# Patient Record
Sex: Female | Born: 1960 | Race: White | Hispanic: No | Marital: Married | Smoking: Former smoker
Health system: Southern US, Community
[De-identification: ages and names within clinical notes are randomized; demographics above are authoritative.]

## PROBLEM LIST (undated history)

## (undated) DIAGNOSIS — M359 Systemic involvement of connective tissue, unspecified: Secondary | ICD-10-CM

## (undated) DIAGNOSIS — G43909 Migraine, unspecified, not intractable, without status migrainosus: Secondary | ICD-10-CM

## (undated) HISTORY — DX: Migraine, unspecified, not intractable, without status migrainosus: G43.909

---

## 1988-05-27 HISTORY — PX: OTHER SURGICAL HISTORY: SHX169

## 1991-05-28 HISTORY — PX: PARTIAL HYSTERECTOMY: SHX80

## 2004-05-27 HISTORY — PX: CHOLECYSTECTOMY: SHX55

## 2004-05-27 HISTORY — PX: OOPHORECTOMY: SHX86

## 2016-03-15 ENCOUNTER — Encounter: Payer: Self-pay | Admitting: Diagnostic Neuroimaging

## 2016-03-15 ENCOUNTER — Ambulatory Visit (INDEPENDENT_AMBULATORY_CARE_PROVIDER_SITE_OTHER): Payer: BLUE CROSS/BLUE SHIELD | Admitting: Neurology

## 2016-03-15 DIAGNOSIS — H4710 Unspecified papilledema: Secondary | ICD-10-CM | POA: Diagnosis not present

## 2016-03-15 DIAGNOSIS — H539 Unspecified visual disturbance: Secondary | ICD-10-CM | POA: Insufficient documentation

## 2016-03-15 DIAGNOSIS — G43909 Migraine, unspecified, not intractable, without status migrainosus: Secondary | ICD-10-CM | POA: Insufficient documentation

## 2016-03-15 DIAGNOSIS — G43009 Migraine without aura, not intractable, without status migrainosus: Secondary | ICD-10-CM

## 2016-03-15 DIAGNOSIS — H9313 Tinnitus, bilateral: Secondary | ICD-10-CM | POA: Diagnosis not present

## 2016-03-15 MED ORDER — SUMATRIPTAN SUCCINATE 100 MG PO TABS
100.0000 mg | ORAL_TABLET | Freq: Once | ORAL | 2 refills | Status: AC | PRN
Start: 2016-03-15 — End: ?

## 2016-03-15 NOTE — Progress Notes (Signed)
[facility]  PATIENT: Carmen White DOB: [birth year]  REFERRING DOCTOR OR PCP:  Dr. Anthony Sar, OD (fax [phone])    Dr. Woody Seller is PCP   SOURCE:   Patient, records from Dr. Marin Comment   HISTORICAL  CHIEF COMPLAINT:  Chief Complaint  Patient presents with  . Mod optic nerve edema, tinnitus    rm 6, New Pt    HISTORY OF PRESENT ILLNESS:  I had the pleasure of seeing your patient, Carmen White, at [facility] for neurologic consultation regarding her bilateral optic nerve edema and headaches. As you know, she is a 55 year old woman reporting headaches who saw Dr. Marin Comment on 02/09/2016 for evaluation of headaches and some visual changes.  Additionally she noted drainage out of the right eye.  During that visit, she was found to have normal visual acuity but bilateral moderate optic nerve edema. No venous pulsations were noted.   Humphrey visual fields were performed showing enlarged blind spots bilaterally.    She is referred for further evaluation.  Carmen White reports a long history of common migraine headaches. These occur about every 2 months and last for the rest of the day or until she falls asleep. When she wakes up that migraine is almost always resolved. Pain is throbbing and is associated with nausea, vomiting, photophobia and phonophobia. Moving increases the pain. Pain is decreased if she sits still in a cold dark room.  Her last migraine was 3 weeks ago. She will take a hydrocodone 5 mg when one occurs and if that does not help willl also take an Excedrin Migraine. She has never tried a triptan. She does not get chronic headache.  She also reports tinnitus specially during allergy season. This is bilateral. Of note, she works in a [employer] and wears earplugs for her entire shift.  She stands up quickly she will get "lightning bugs" in her vision and sometimes lightheadedness. She gets similar symptoms if she bends over and stands up quickly. She has not  noted any blind spots or double vision. Weight has increased to 165 pounds 220 pounds over 15 years and she feels the weight gain  has been slow and steady.    She has not been told that she snores and she does not wake up gasping for air.      REVIEW OF SYSTEMS: Constitutional: No fevers, chills, sweats, or change in appetite Eyes: as above Ear, nose and throat: No hearing loss, ear pain, nasal congestion, sore throat Cardiovascular: No chest pain, palpitations Respiratory: No shortness of breath at rest or with exertion.   No wheezes GastrointestinaI: No nausea, vomiting, diarrhea, abdominal pain, fecal incontinence.   She has GERD Genitourinary: No dysuria, urinary retention or frequency.  No nocturia. Musculoskeletal: No neck pain,.  She reports back pain and has had epidural steroid injections in the past.  Integumentary: No rash, pruritus, skin lesions Neurological: as above Psychiatric: Has had some depression and anxiety but not severe.   Endocrine: No palpitations, diaphoresis, change in appetite, change in weigh or increased thirst Hematologic/Lymphatic: No anemia, purpura, petechiae. Allergic/Immunologic: Notes itchy/runny eyes, nasal congestion several times a year.   Denies rashes  ALLERGIES: No Known Allergies  HOME MEDICATIONS:  Current Outpatient Prescriptions:  .  Ascorbic Acid (VITAMIN C) 1000 MG tablet, Take 1,000 mg by mouth daily., Disp: , Rfl:  .  aspirin 81 MG chewable tablet, Chew by mouth daily., Disp: , Rfl:  .  Calcium 600-200 MG-UNIT tablet, Take 1 tablet  by mouth daily., Disp: , Rfl:  .  Cholecalciferol (VITAMIN D-3) 1000 units CAPS, Take by mouth. 5000 mg 4 a day, Disp: , Rfl:  .  cyclobenzaprine (FLEXERIL) 10 MG tablet, 10 mg daily., Disp: , Rfl: 0 .  DULoxetine (CYMBALTA) 60 MG capsule, 60 mg daily., Disp: , Rfl: 2 .  esomeprazole (NEXIUM) 40 MG capsule, 40 mg daily., Disp: , Rfl: 1 .  ezetimibe (ZETIA) 10 MG tablet, 10 mg daily., Disp: , Rfl: 2 .   HYDROcodone-acetaminophen (NORCO/VICODIN) 5-325 MG tablet, Take 1 tablet by mouth every 6 (six) hours as needed for moderate pain., Disp: , Rfl:  .  ranitidine (ZANTAC) 300 MG tablet, 300 mg 2 (two) times daily., Disp: , Rfl: 1 .  zinc gluconate 50 MG tablet, Take 50 mg by mouth daily., Disp: , Rfl:   PAST MEDICAL HISTORY: Past Medical History:  Diagnosis Date  . Migraines     PAST SURGICAL HISTORY: Past Surgical History:  Procedure Laterality Date  . CHOLECYSTECTOMY  2006  . OOPHORECTOMY Left 2006  . PARTIAL HYSTERECTOMY  1993  . tubal ligaiton  1990    FAMILY HISTORY: Family History  Problem Relation Age of Onset  . Cancer Sister   . Hypertension Sister   . Hypertension Brother   . Diabetes Brother     SOCIAL HISTORY:  Social History   Social History  . Marital status: Married    Spouse name: N/A  . Number of children: 4  . Years of education: 12   Occupational History  .      [employer]   Social History Main Topics  . Smoking status: Current Every Day Smoker    Packs/day: 0.75  . Smokeless tobacco: Never Used  . Alcohol use Yes     Comment: 8 oz weekly  . Drug use: No  . Sexual activity: Not on file   Other Topics Concern  . Not on file   Social History Narrative   Lives at home with husband, sons   Caffeine - 03/15/16 currently none due to UTI, hx 2 -20 oz bottles Mtn Dew daily     PHYSICAL EXAM  Vitals:   03/15/16 1056  BP: 130/80  Pulse: 77  Weight: 221 lb 3.2 oz (100.3 kg)  Height: 5\' 9"  (1.753 m)    Body mass index is 32.67 kg/m.   General: The patient is well-developed and well-nourished and in no acute distress  Eyes:  Funduscopic exam shows mild left optic nerve edema.   Due to small right pupil size, I can't see fundus well on that side.  Neck: The neck is supple, no carotid bruits are noted.  The neck is nontender.  Cardiovascular: The heart has a regular rate and rhythm with a normal S1 and S2. There were no murmurs,  gallops or rubs. Lungs are clear to auscultation.  Skin: Extremities are without significant edema.  Musculoskeletal:  Back is nontender  Neurologic Exam  Mental status: The patient is alert and oriented x 3 at the time of the examination. The patient has apparent normal recent and remote memory, with an apparently normal attention span and concentration ability.   Speech is normal.  Cranial nerves: Extraocular movements are full. The left pupil is smaller than the left.   Both react.  Visual fields are full.  Facial symmetry is present. There is good facial sensation to soft touch bilaterally.Facial strength is normal.  Trapezius and sternocleidomastoid strength is normal. No dysarthria is noted.  The tongue is midline, and the patient has symmetric elevation of the soft palate. No obvious hearing deficits are noted.  Motor:  Muscle bulk is normal.   Tone is normal. Strength is  5 / 5 in all 4 extremities.   Sensory: Sensory testing is intact to pinprick, soft touch and vibration sensation in all 4 extremities.  Coordination: Cerebellar testing reveals good finger-nose-finger and heel-to-shin bilaterally.  Gait and station: Station is normal.   Gait is normal. Tandem gait is normal. Romberg is negative.   Reflexes: Deep tendon reflexes are symmetric and normal bilaterally.   Plantar responses are flexor.    DIAGNOSTIC DATA (LABS, IMAGING, TESTING) - I reviewed patient records, labs, notes, testing and imaging myself where available.      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  On my examiation I was able to confirm the papilledema on the left though I had difficulty seeing the fundus on the right.   I discussed with Carmen White that the most likely aspiration for her symptoms is idiopathic intracranial hypertension (pseudotumor cerebri) to rule out other processes such as an intracranial mass I will get an MRI of the brain with and without contrast. If there is no contraindication, after that we will proceed with a lumbar puncture to measure intracranial pressures.     If pressures are elevated I will start acetazolamide and have her follow up with Dr. Marin Comment to assess for resolution of optic nerve edema and to make sure visual fields are not worsening.   Her headaches appear to be more typical common migraines and I will prescribe sumatriptan to help her recover from these more quickly.   she will return to see me in 2 months or sooner if there are new or worsening neurologic symptoms. We will keep her informed of the results of the MRI and if she has one, the lumbar puncture.   She should call us back sooner if she has new or worsening neurologic symptoms.  Thank you for asking me to see Carmen White for a neurologic consultation. Please let me know if I can be of further assistance with her or other patients in the future.   Bridney Guadarrama A. Felecia Shelling, MD, PhD [id number], [id number] AM Certified in Neurology, Clinical Neurophysiology, Sleep Medicine, Pain Medicine and Neuroimaging  [facility] [address] [place], [place] [address] [phone]

## 2016-03-29 ENCOUNTER — Telehealth: Payer: Self-pay | Admitting: Neurology

## 2016-03-29 DIAGNOSIS — H4710 Unspecified papilledema: Secondary | ICD-10-CM

## 2016-03-29 DIAGNOSIS — H539 Unspecified visual disturbance: Secondary | ICD-10-CM

## 2016-03-29 NOTE — Telephone Encounter (Signed)
Carmen White wants her MRI to be done at [facility] in [place]. I spoke with the scheduler and made her appointment for 04/03/16. She said that the patient needed to have some lab work.. If you put the order in I can fax the lab order along with the MRI order to [facility] because she can have the lab work one hour before her MRI.

## 2016-03-29 NOTE — Telephone Encounter (Signed)
Noted, thank you faxed the MRI order and lab order to [facility].

## 2016-04-01 NOTE — Telephone Encounter (Signed)
I spoke with Carmen White this morning and informed her that I made her appt at [facility] for this Wednesday 04/03/16 for her MRI.Marland Kitchen She informed me that she cannot do Wednesday. I told her she can call and rescheduled with [place] that is a good time for her and I gave their phone number which is [phone] opt [phone]

## 2016-07-02 ENCOUNTER — Telehealth: Payer: Self-pay | Admitting: *Deleted

## 2016-07-02 NOTE — Telephone Encounter (Signed)
LMTC./fim 

## 2016-07-02 NOTE — Telephone Encounter (Signed)
-----   Message from Britt Bottom, MD sent at 07/01/2016  5:35 PM EST ----- I had seen Carmen White for consultation regarding papilledema a couple months ago and she had an MRI of the brain at [facility] which is essentially normal.     I haven't seen her back in follow-up and I don't see an appointment scheduled.     She likely has increased cranial pressure and the plan was to get a lumbar puncture to measure pressure the MRI was normal.     If she is still having visual symptoms, we need to get this.

## 2017-05-21 ENCOUNTER — Ambulatory Visit: Payer: BLUE CROSS/BLUE SHIELD | Admitting: Cardiovascular Disease

## 2017-05-21 ENCOUNTER — Telehealth: Payer: Self-pay | Admitting: Cardiovascular Disease

## 2017-05-21 ENCOUNTER — Encounter: Payer: Self-pay | Admitting: Cardiovascular Disease

## 2017-05-21 ENCOUNTER — Encounter: Payer: Self-pay | Admitting: *Deleted

## 2017-05-21 VITALS — BP 125/82 | HR 74 | Ht 68.0 in | Wt 231.0 lb

## 2017-05-21 DIAGNOSIS — K219 Gastro-esophageal reflux disease without esophagitis: Secondary | ICD-10-CM | POA: Diagnosis not present

## 2017-05-21 DIAGNOSIS — Z87891 Personal history of nicotine dependence: Secondary | ICD-10-CM | POA: Diagnosis not present

## 2017-05-21 DIAGNOSIS — E781 Pure hyperglyceridemia: Secondary | ICD-10-CM

## 2017-05-21 DIAGNOSIS — R0609 Other forms of dyspnea: Secondary | ICD-10-CM

## 2017-05-21 NOTE — Progress Notes (Signed)
CARDIOLOGY CONSULT NOTE  Patient ID: Carmen White MRN: [id number] DOB/AGE: [birth year] 56 y.o.  Admit date: (Not on file) Primary Physician: Glenda Chroman, MD Referring Physician: Arsenio Katz, NP.  Reason for Consultation: Shortness of breath  HPI: Carmen White is a 56 y.o. female who is being seen today for the evaluation of shortness of breathat the request of Arsenio Katz, NP.  Past medical history includes GERD and hypercholesterolemia.  She has a prior history of tobacco abuse and quit in April 2018.  I personally reviewed all relevant documentation, labs, and studies from her PCP.  ECG performed at an outside facility on 04/14/17 demonstrated vigorous left ventricular systolic function, LVEF 25-36%, normal regional wall motion, diastolic dysfunction, and no significant valvular pathology.  ECG performed on 04/07/17 which I personally interpreted demonstrated sinus rhythm with underlying artifact and nonspecific ST segment and T wave abnormalities.  She said she has been experiencing exertional dyspnea for the past 2 months.  It has progressively gotten worse.  While walking with her son she has to stop several times.  When she bends over to tie her shoes she said she feels like she is going to pass out.  She has retrosternal chest tightness which is not associated with exertion and she attributes this to acid reflux disease.  She has also had a 30 pound weight gain in the past 8 months.  She smoked a pack of cigarettes daily for 40 years.  She has chronic lower extremity swelling bilaterally and uses compression stockings.  She works for a [employer] on concrete floors all night long.     No Known Allergies  Current Outpatient Medications  Medication Sig Dispense Refill  . Ascorbic Acid (VITAMIN C) 1000 MG tablet Take 1,000 mg by mouth daily.    Marland Kitchen aspirin 81 MG chewable tablet Chew by mouth daily.    . Calcium 600-200 MG-UNIT tablet Take 1 tablet by mouth daily.     . Cholecalciferol (VITAMIN D-3) 1000 units CAPS Take by mouth. 5000 mg 4 a day    . cyclobenzaprine (FLEXERIL) 10 MG tablet 10 mg daily.  0  . DULoxetine (CYMBALTA) 60 MG capsule 60 mg daily.  2  . esomeprazole (NEXIUM) 40 MG capsule 40 mg daily.  1  . ezetimibe (ZETIA) 10 MG tablet 10 mg daily.  2  . HYDROcodone-acetaminophen (NORCO/VICODIN) 5-325 MG tablet Take 1 tablet by mouth every 6 (six) hours as needed for moderate pain.    . ranitidine (ZANTAC) 300 MG tablet 300 mg 2 (two) times daily.  1  . SUMAtriptan (IMITREX) 100 MG tablet Take 1 tablet (100 mg total) by mouth once as needed for migraine. May repeat in 2 hours if headache persists or recurs. 10 tablet 2  . zinc gluconate 50 MG tablet Take 50 mg by mouth daily.     No current facility-administered medications for this visit.     Past Medical History:  Diagnosis Date  . Migraines     Past Surgical History:  Procedure Laterality Date  . CHOLECYSTECTOMY  2006  . OOPHORECTOMY Left 2006  . PARTIAL HYSTERECTOMY  1993  . tubal ligaiton  1990    Social History   Socioeconomic History  . Marital status: Married    Spouse name: Not on file  . Number of children: 4  . Years of education: 28  . Highest education level: Not on file  Social Needs  . Financial resource strain: Not on file  .  Food insecurity - worry: Not on file  . Food insecurity - inability: Not on file  . Transportation needs - medical: Not on file  . Transportation needs - non-medical: Not on file  Occupational History    Comment: [employer]  Tobacco Use  . Smoking status: Former Smoker    Packs/day: 0.75    Years: 41.00    Pack years: 30.75    Start date: 06/29/1975    Last attempt to quit: 09/19/2016    Years since quitting: 0.6  . Smokeless tobacco: Never Used  Substance and Sexual Activity  . Alcohol use: Yes    Comment: 8 oz weekly  . Drug use: No  . Sexual activity: Not on file  Other Topics Concern  . Not on file  Social History  Narrative   Lives at home with husband, sons   Caffeine - 03/15/16 currently none due to UTI, hx 2 -20 oz bottles Mtn Dew daily     No family history of premature CAD in 1st degree relatives.  Current Meds  Medication Sig  . Ascorbic Acid (VITAMIN C) 1000 MG tablet Take 1,000 mg by mouth daily.  Marland Kitchen aspirin 81 MG chewable tablet Chew by mouth daily.  . Calcium 600-200 MG-UNIT tablet Take 1 tablet by mouth daily.  . Cholecalciferol (VITAMIN D-3) 1000 units CAPS Take by mouth. 5000 mg 4 a day  . cyclobenzaprine (FLEXERIL) 10 MG tablet 10 mg daily.  . DULoxetine (CYMBALTA) 60 MG capsule 60 mg daily.  Marland Kitchen esomeprazole (NEXIUM) 40 MG capsule 40 mg daily.  Marland Kitchen ezetimibe (ZETIA) 10 MG tablet 10 mg daily.  Marland Kitchen HYDROcodone-acetaminophen (NORCO/VICODIN) 5-325 MG tablet Take 1 tablet by mouth every 6 (six) hours as needed for moderate pain.  . ranitidine (ZANTAC) 300 MG tablet 300 mg 2 (two) times daily.  . SUMAtriptan (IMITREX) 100 MG tablet Take 1 tablet (100 mg total) by mouth once as needed for migraine. May repeat in 2 hours if headache persists or recurs.  Marland Kitchen zinc gluconate 50 MG tablet Take 50 mg by mouth daily.      Review of systems complete and found to be negative unless listed above in HPI    Physical exam Blood pressure 125/82, pulse 74, height 5\' 8"  (1.727 m), weight 231 lb (104.8 kg), SpO2 94 %. General: NAD Neck: No JVD, no thyromegaly or thyroid nodule.  Lungs: Clear to auscultation bilaterally with normal respiratory effort. CV: Nondisplaced PMI. Regular rate and rhythm, normal S1/S2, no S3/S4, no murmur.  No peripheral edema.  No carotid bruit.    Abdomen: Soft, nontender, no distention.  Skin: Intact without lesions or rashes.  Neurologic: Alert and oriented x 3.  Psych: Normal affect. Extremities: No clubbing or cyanosis.  HEENT: Normal.   ECG: Most recent ECG reviewed.   Labs: No results found for: K, BUN, CREATININE, ALT, TSH, HGB   Lipids: No results found for:  LDLCALC, LDLDIRECT, CHOL, TRIG, HDL      ASSESSMENT AND PLAN:  1.  Exertional dyspnea: Risk factors for cardiac disease include hypertriglyceridemia and a long history of tobacco abuse.  I will obtain an exercise Myoview nuclear stress test to evaluate for ischemic heart disease.  She is also certainly at risk for chronic obstructive lung disease.  2.  Hypercholesterolemia: Taking Zetia.  3.  GERD: Takes Nexium and Zantac.     Disposition: Follow up in 6 weeks.  Signed: Kate Sable, M.D., F.A.C.C.  05/21/2017, 10:09 AM

## 2017-05-21 NOTE — Patient Instructions (Addendum)
Medication Instructions:  Continue all current medications.  Labwork: none  Testing/Procedures: Your physician has requested that you have an exercise stress myoview. For further information please visit [email or web address]. Please follow instruction sheet, as given.  Office will contact with results via phone or letter.    Follow-Up: 6 weeks   Any Other Special Instructions Will Be Listed Below (If Applicable).   If you need a refill on your cardiac medications before your next appointment, please call your pharmacy.  

## 2017-05-21 NOTE — Telephone Encounter (Signed)
Pre-cert Verification for the following procedure  Stress test scheduled for 06-13-17 at [facility]

## 2017-06-13 ENCOUNTER — Ambulatory Visit (HOSPITAL_COMMUNITY)
Admission: RE | Admit: 2017-06-13 | Discharge: 2017-06-13 | Disposition: A | Discharge status: Home / Self Care | Payer: BLUE CROSS/BLUE SHIELD | Source: Ambulatory Visit | Attending: Cardiovascular Disease | Admitting: Cardiovascular Disease

## 2017-06-13 ENCOUNTER — Encounter (HOSPITAL_COMMUNITY)
Admission: RE | Admit: 2017-06-13 | Discharge: 2017-06-13 | Disposition: A | Discharge status: Home / Self Care | Payer: BLUE CROSS/BLUE SHIELD | Source: Ambulatory Visit | Attending: Cardiovascular Disease | Admitting: Cardiovascular Disease

## 2017-06-13 ENCOUNTER — Telehealth: Payer: Self-pay | Admitting: *Deleted

## 2017-06-13 ENCOUNTER — Encounter (HOSPITAL_COMMUNITY): Payer: Self-pay

## 2017-06-13 DIAGNOSIS — R0609 Other forms of dyspnea: Secondary | ICD-10-CM | POA: Insufficient documentation

## 2017-06-13 DIAGNOSIS — R9439 Abnormal result of other cardiovascular function study: Secondary | ICD-10-CM | POA: Insufficient documentation

## 2017-06-13 HISTORY — DX: Systemic involvement of connective tissue, unspecified: M35.9

## 2017-06-13 LAB — NM MYOCAR MULTI W/SPECT W/WALL MOTION / EF
CHL CUP NUCLEAR SSS: 5
CHL RATE OF PERCEIVED EXERTION: 13
CSEPED: 4 min
Estimated workload: 7 METS
Exercise duration (sec): 21 s
LHR: 0.39
LV dias vol: 96 mL (ref 46–106)
LVSYSVOL: 38 mL
MPHR: 162 {beats}/min
NUC STRESS TID: 1.06
Peak HR: 155 {beats}/min
Percent HR: 95 %
Rest HR: 75 {beats}/min
SDS: 4
SRS: 1

## 2017-06-13 MED ORDER — TECHNETIUM TC 99M TETROFOSMIN IV KIT
30.0000 | PACK | Freq: Once | INTRAVENOUS | Status: AC | PRN
  Administered 2017-06-13: 32 via INTRAVENOUS

## 2017-06-13 MED ORDER — SODIUM CHLORIDE 0.9% FLUSH
INTRAVENOUS | Status: AC
  Administered 2017-06-13: 10 mL via INTRAVENOUS
  Filled 2017-06-13: qty 10

## 2017-06-13 MED ORDER — TECHNETIUM TC 99M TETROFOSMIN IV KIT
10.0000 | PACK | Freq: Once | INTRAVENOUS | Status: AC | PRN
  Administered 2017-06-13: 11 via INTRAVENOUS

## 2017-06-13 MED ORDER — REGADENOSON 0.4 MG/5ML IV SOLN
INTRAVENOUS | Status: AC
  Filled 2017-06-13: qty 5

## 2017-06-13 NOTE — Telephone Encounter (Signed)
Notes recorded by Laurine Blazer, LPN on 10/23/509 at 0:21 PM EST Patient notified. Copy to pmd. Follow up scheduled for 06/27/2017 with Dr. Bronson Ing.   ------  Notes recorded by Herminio Commons, MD on 06/13/2017 at 4:46 PM EST Low risk overall with normal LV function. There are some abnormalities which may be due to "shadowing" artifact. I will discuss at fu ov.

## 2017-06-27 ENCOUNTER — Ambulatory Visit: Payer: BLUE CROSS/BLUE SHIELD | Admitting: Cardiovascular Disease

## 2017-06-27 ENCOUNTER — Encounter: Payer: Self-pay | Admitting: Cardiovascular Disease

## 2017-06-27 ENCOUNTER — Other Ambulatory Visit: Payer: Self-pay

## 2017-06-27 VITALS — BP 138/82 | HR 77 | Ht 69.0 in | Wt 238.0 lb

## 2017-06-27 DIAGNOSIS — Z87891 Personal history of nicotine dependence: Secondary | ICD-10-CM | POA: Diagnosis not present

## 2017-06-27 DIAGNOSIS — R0609 Other forms of dyspnea: Secondary | ICD-10-CM | POA: Diagnosis not present

## 2017-06-27 DIAGNOSIS — K219 Gastro-esophageal reflux disease without esophagitis: Secondary | ICD-10-CM

## 2017-06-27 DIAGNOSIS — E781 Pure hyperglyceridemia: Secondary | ICD-10-CM | POA: Diagnosis not present

## 2017-06-27 NOTE — Patient Instructions (Addendum)
Medication Instructions:  Continue all current medications.  Labwork: none  Testing/Procedures: none  Follow-Up: As needed.    Any Other Special Instructions Will Be Listed Below (If Applicable).  If you need a refill on your cardiac medications before your next appointment, please call your pharmacy.  

## 2017-06-27 NOTE — Progress Notes (Signed)
SUBJECTIVE: The patient returns for follow-up after undergoing cardiovascular testing performed for the evaluation of exertional dyspnea.  Nuclear stress test on 06/13/17 demonstrated a hypertensive response to exercise.  Overall findings were suggestive of prior myocardial infarction with a mild degree of peri-infarct ischemia in the inferolateral wall.  However, variable soft tissue attenuation artifact could not entirely be ruled out.  It was a low risk study, LVEF 60%.  She has problems with acid reflux.  She quit smoking and has put on 40 pounds in the past year and thinks her exertional dyspnea is likely due to this.  She denies chest pain, palpitations, and leg swelling.     Review of Systems: As per "subjective", otherwise negative.  No Known Allergies  Current Outpatient Medications  Medication Sig Dispense Refill  . Ascorbic Acid (VITAMIN C) 1000 MG tablet Take 1,000 mg by mouth daily.    Marland Kitchen aspirin 81 MG chewable tablet Chew by mouth daily.    . Calcium 600-200 MG-UNIT tablet Take 1 tablet by mouth daily.    . Cholecalciferol (VITAMIN D-3) 1000 units CAPS Take by mouth. 5000 mg 4 a day    . cyclobenzaprine (FLEXERIL) 10 MG tablet 10 mg daily.  0  . DULoxetine (CYMBALTA) 60 MG capsule 60 mg daily.  2  . esomeprazole (NEXIUM) 40 MG capsule 40 mg daily.  1  . ezetimibe (ZETIA) 10 MG tablet 10 mg daily.  2  . HYDROcodone-acetaminophen (NORCO/VICODIN) 5-325 MG tablet Take 1 tablet by mouth every 6 (six) hours as needed for moderate pain.    . ranitidine (ZANTAC) 300 MG tablet 300 mg 2 (two) times daily.  1  . SUMAtriptan (IMITREX) 100 MG tablet Take 1 tablet (100 mg total) by mouth once as needed for migraine. May repeat in 2 hours if headache persists or recurs. 10 tablet 2  . zinc gluconate 50 MG tablet Take 50 mg by mouth daily.     No current facility-administered medications for this visit.     Past Medical History:  Diagnosis Date  . Collagen vascular disease  ([place])   . Migraines     Past Surgical History:  Procedure Laterality Date  . CHOLECYSTECTOMY  2006  . OOPHORECTOMY Left 2006  . PARTIAL HYSTERECTOMY  1993  . tubal ligaiton  1990    Social History   Socioeconomic History  . Marital status: Married    Spouse name: Not on file  . Number of children: 4  . Years of education: 54  . Highest education level: Not on file  Social Needs  . Financial resource strain: Not on file  . Food insecurity - worry: Not on file  . Food insecurity - inability: Not on file  . Transportation needs - medical: Not on file  . Transportation needs - non-medical: Not on file  Occupational History    Comment: [employer]  Tobacco Use  . Smoking status: Former Smoker    Packs/day: 0.75    Years: 41.00    Pack years: 30.75    Start date: 06/29/1975    Last attempt to quit: 09/19/2016    Years since quitting: 0.7  . Smokeless tobacco: Never Used  Substance and Sexual Activity  . Alcohol use: Yes    Comment: 8 oz weekly  . Drug use: No  . Sexual activity: Not on file  Other Topics Concern  . Not on file  Social History Narrative   Lives at home with husband, sons  Caffeine - 03/15/16 currently none due to UTI, hx 2 -20 oz bottles Mtn Dew daily     Vitals:   06/27/17 1052  BP: 138/82  Pulse: 77  SpO2: 95%  Weight: 238 lb (108 kg)  Height: 5\' 9"  (1.753 m)    Wt Readings from Last 3 Encounters:  06/27/17 238 lb (108 kg)  05/21/17 231 lb (104.8 kg)  03/15/16 221 lb 3.2 oz (100.3 kg)     PHYSICAL EXAM General: NAD HEENT: Normal. Neck: No JVD, no thyromegaly. Lungs: Clear to auscultation bilaterally with normal respiratory effort. CV: Regular rate and rhythm, normal S1/S2, no S3/S4, no murmur. No pretibial or periankle edema.  No carotid bruit.   Abdomen: Soft, nontender, no distention.  Neurologic: Alert and oriented.  Psych: Normal affect. Skin: Normal. Musculoskeletal: No gross deformities.    ECG: Most recent ECG  reviewed.   Labs: No results found for: K, BUN, CREATININE, ALT, TSH, HGB   Lipids: No results found for: LDLCALC, LDLDIRECT, CHOL, TRIG, HDL     ASSESSMENT AND PLAN: 1.  Exertional dyspnea: I feel that the findings on stress testing were likely due to soft tissue attenuation artifact.  Given her long history of tobacco abuse, I would consider pulmonary function testing to evaluate for COPD.  2.  Hypercholesterolemia: Taking Zetia.  3.  GERD: Takes Nexium and Zantac.    Disposition: Follow up as needed   Kate Sable, M.D., F.A.C.C.

## 2017-10-27 ENCOUNTER — Ambulatory Visit (INDEPENDENT_AMBULATORY_CARE_PROVIDER_SITE_OTHER): Payer: BLUE CROSS/BLUE SHIELD | Admitting: Otolaryngology

## 2017-10-27 DIAGNOSIS — R1312 Dysphagia, oropharyngeal phase: Secondary | ICD-10-CM | POA: Diagnosis not present

## 2017-10-27 DIAGNOSIS — K219 Gastro-esophageal reflux disease without esophagitis: Secondary | ICD-10-CM

## 2017-10-27 DIAGNOSIS — D440 Neoplasm of uncertain behavior of thyroid gland: Secondary | ICD-10-CM

## 2017-10-28 ENCOUNTER — Other Ambulatory Visit (INDEPENDENT_AMBULATORY_CARE_PROVIDER_SITE_OTHER): Payer: Self-pay | Admitting: Otolaryngology

## 2017-10-28 DIAGNOSIS — R1310 Dysphagia, unspecified: Secondary | ICD-10-CM

## 2017-10-28 DIAGNOSIS — E041 Nontoxic single thyroid nodule: Secondary | ICD-10-CM

## 2017-12-19 ENCOUNTER — Ambulatory Visit (HOSPITAL_COMMUNITY)
Admission: RE | Admit: 2017-12-19 | Discharge: 2017-12-19 | Disposition: A | Discharge status: Home / Self Care | Payer: BLUE CROSS/BLUE SHIELD | Source: Ambulatory Visit | Attending: Otolaryngology | Admitting: Otolaryngology

## 2017-12-19 DIAGNOSIS — R1310 Dysphagia, unspecified: Secondary | ICD-10-CM | POA: Insufficient documentation

## 2017-12-19 DIAGNOSIS — E041 Nontoxic single thyroid nodule: Secondary | ICD-10-CM | POA: Diagnosis not present

## 2018-01-06 ENCOUNTER — Other Ambulatory Visit (INDEPENDENT_AMBULATORY_CARE_PROVIDER_SITE_OTHER): Payer: Self-pay | Admitting: Otolaryngology

## 2018-01-06 DIAGNOSIS — E041 Nontoxic single thyroid nodule: Secondary | ICD-10-CM

## 2018-01-12 ENCOUNTER — Ambulatory Visit (INDEPENDENT_AMBULATORY_CARE_PROVIDER_SITE_OTHER): Payer: BLUE CROSS/BLUE SHIELD | Admitting: Otolaryngology

## 2018-01-12 DIAGNOSIS — R1312 Dysphagia, oropharyngeal phase: Secondary | ICD-10-CM | POA: Diagnosis not present

## 2018-01-12 DIAGNOSIS — D440 Neoplasm of uncertain behavior of thyroid gland: Secondary | ICD-10-CM

## 2018-01-12 DIAGNOSIS — K219 Gastro-esophageal reflux disease without esophagitis: Secondary | ICD-10-CM

## 2018-01-22 ENCOUNTER — Ambulatory Visit (HOSPITAL_COMMUNITY)
Admission: RE | Admit: 2018-01-22 | Discharge: 2018-01-22 | Disposition: A | Discharge status: Home / Self Care | Payer: BLUE CROSS/BLUE SHIELD | Source: Ambulatory Visit | Attending: Otolaryngology | Admitting: Otolaryngology

## 2018-01-22 ENCOUNTER — Encounter (HOSPITAL_COMMUNITY): Payer: Self-pay

## 2018-01-22 DIAGNOSIS — E041 Nontoxic single thyroid nodule: Secondary | ICD-10-CM | POA: Diagnosis present

## 2018-01-22 MED ORDER — LIDOCAINE HCL (PF) 2 % IJ SOLN
INTRAMUSCULAR | Status: AC
  Filled 2018-01-22: qty 10

## 2019-02-04 ENCOUNTER — Ambulatory Visit (INDEPENDENT_AMBULATORY_CARE_PROVIDER_SITE_OTHER): Payer: BLUE CROSS/BLUE SHIELD | Admitting: Otolaryngology

## 2019-06-06 IMAGING — RF DG ESOPHAGUS
7 of 8 series · 15 of 19 positions shown · non-contrast
Comparison: None.

CLINICAL DATA: Dysphagia.

EXAM:
ESOPHOGRAM / BARIUM SWALLOW / BARIUM TABLET STUDY
TECHNIQUE: Combined double contrast and single contrast examination performed
using effervescent crystals, thick barium liquid, and thin barium
liquid. The patient was observed with fluoroscopy swallowing a 13 mm
barium sulphate tablet.
FLUOROSCOPY TIME:  Fluoroscopy Time:  1 minutes 12 seconds

[Series 1: cp_standard · 0.28mm/px · 2 of 13 frames shown (1 of 4)]
[frame 2/13]
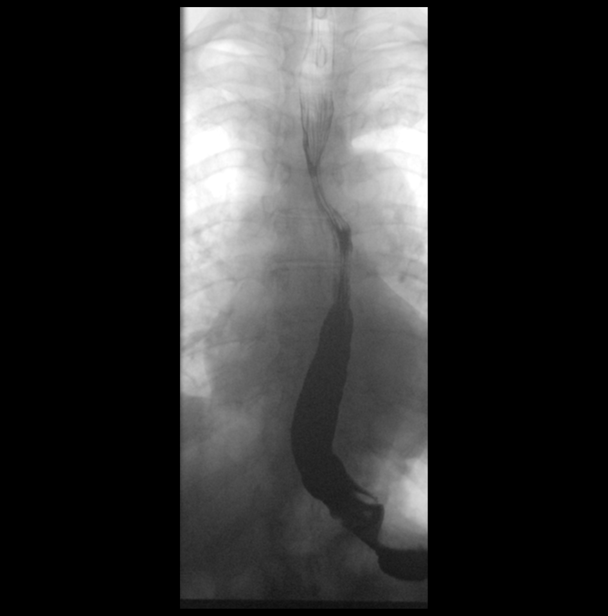
[frame 7/13]
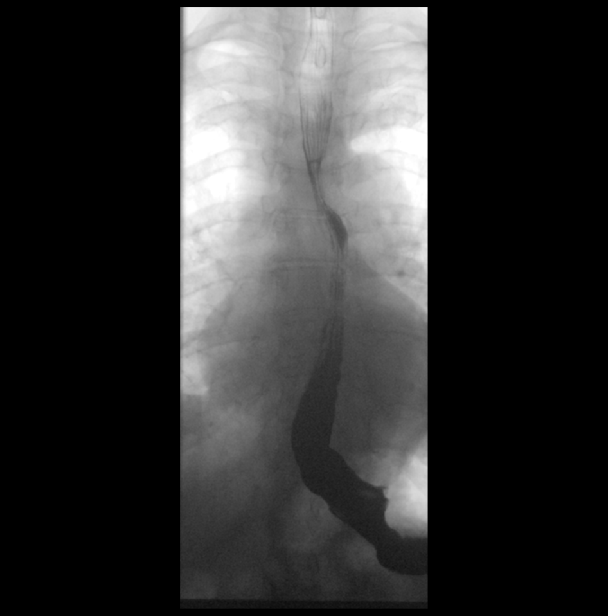

[Series 2: cp_standard · 0.28mm/px · 4 of 80 frames shown (2 of 4)]
[frame 13/80]
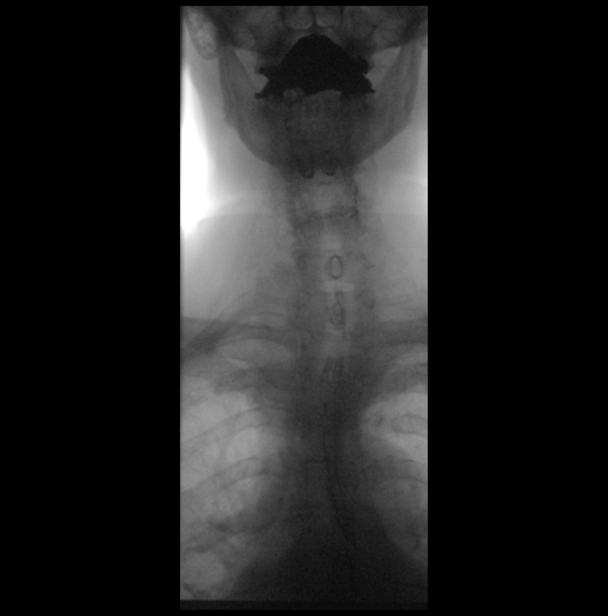
[frame 18/80]
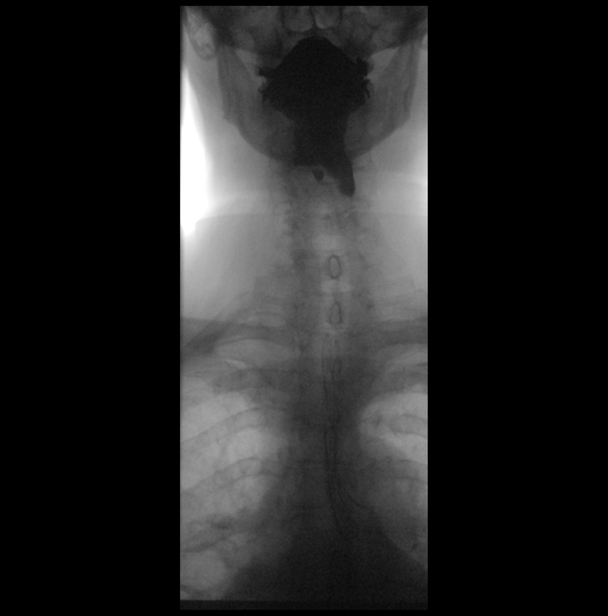
[frame 41/80]
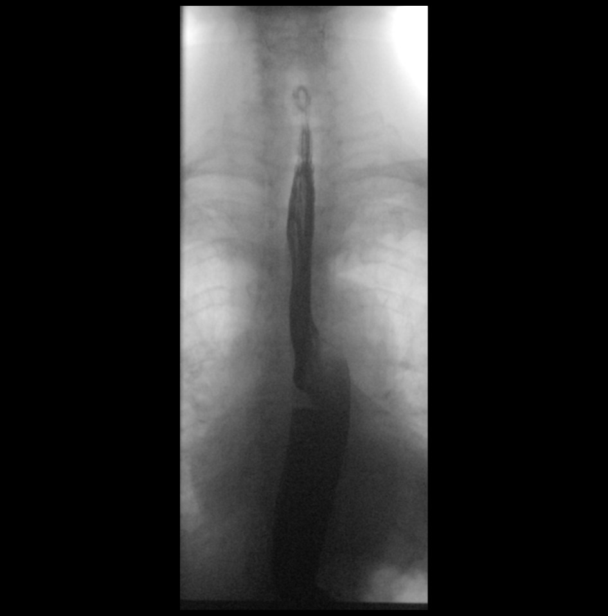
[frame 69/80]
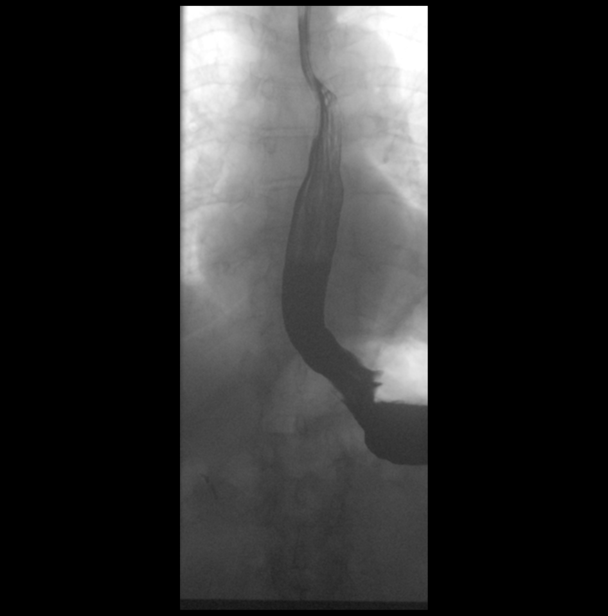

[Series 3: cp_standard · 0.26mm/px · 3 of 70 frames shown (3 of 4)]
[frame 36/70]
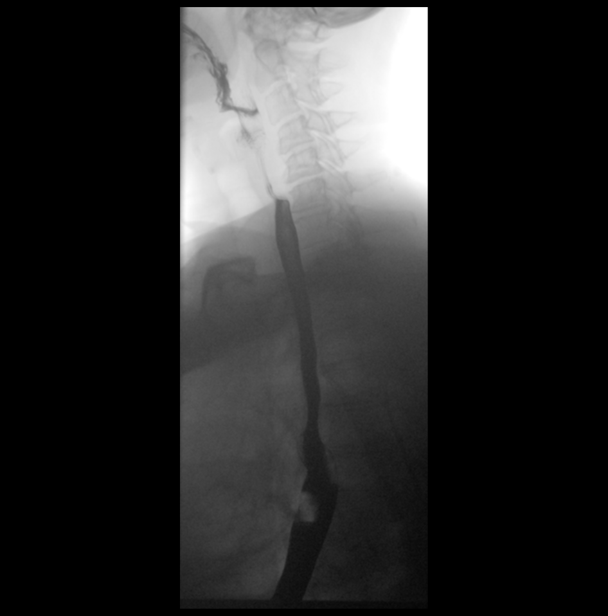
[frame 60/70]
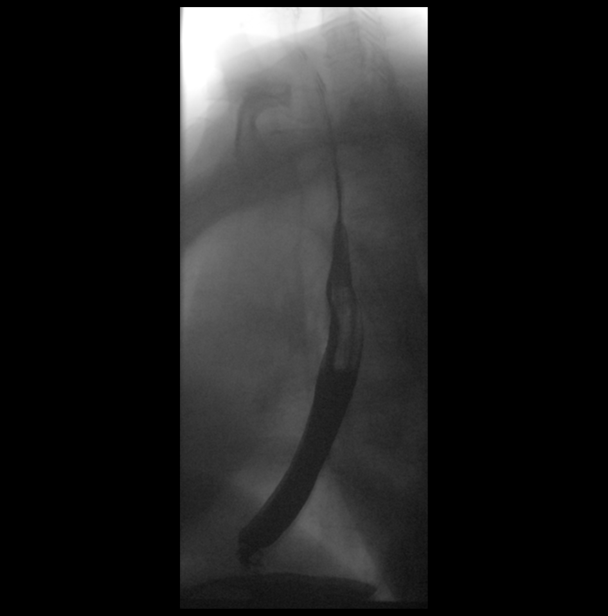
[frame 70/70]
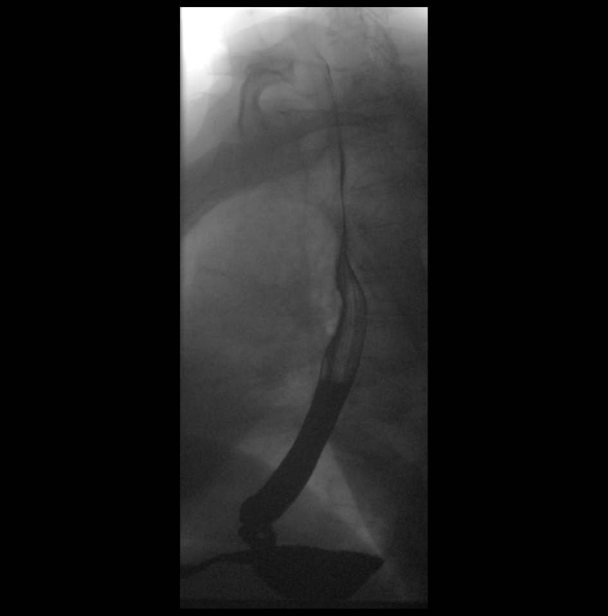

[Series 5: cp_standard · 0.26mm/px · 1 of 1 slices shown (4 of 4)]
[im 1/1]
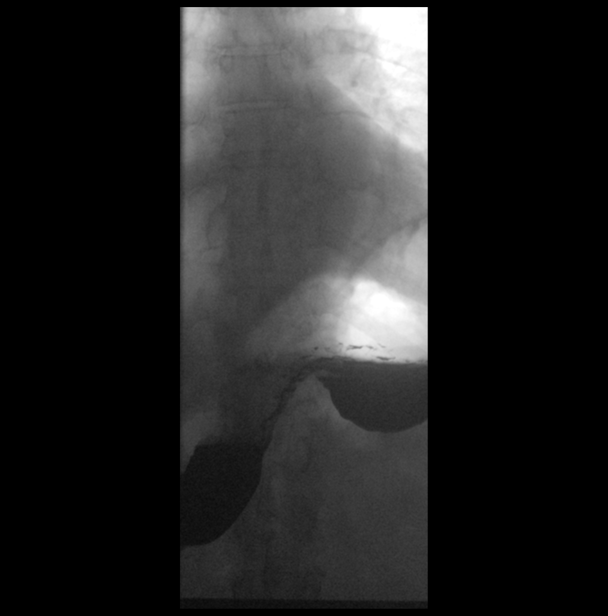

[Series 6: fluoro_barium 2fps_bw · 0.20mm/px · 2 of 2 frames shown (1 of 3)]
[frame 1/2]
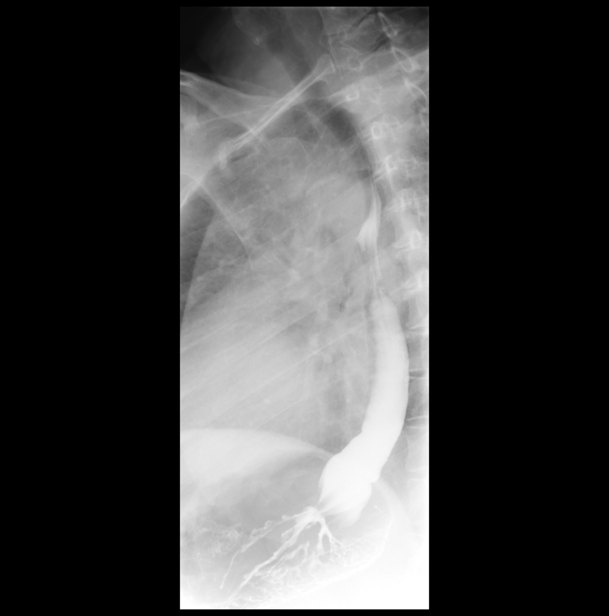
[frame 2/2]
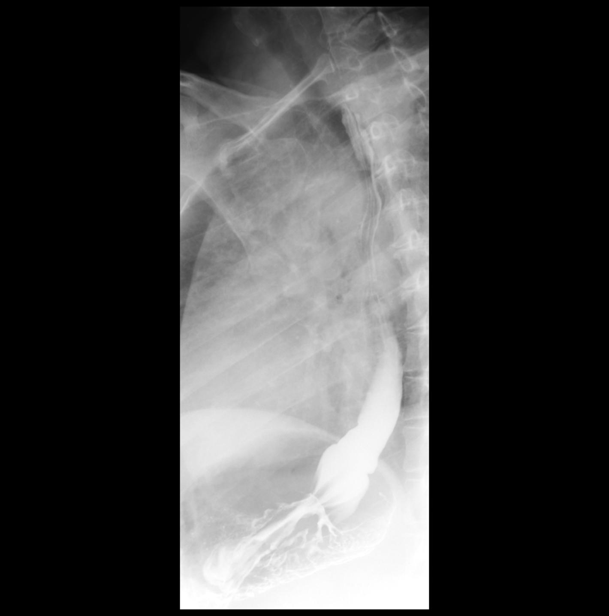

[Series 7: fluoro_barium 2fps_bw · 0.20mm/px · 1 of 2 frames shown (2 of 3)]
[frame 1/2]
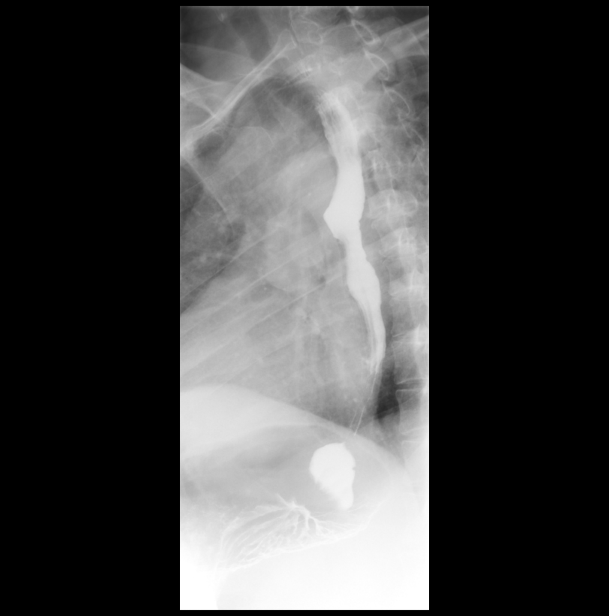

[Series 8: fluoro_barium 2fps_bw · 0.20mm/px · 2 of 2 frames shown (3 of 3)]
[frame 1/2]
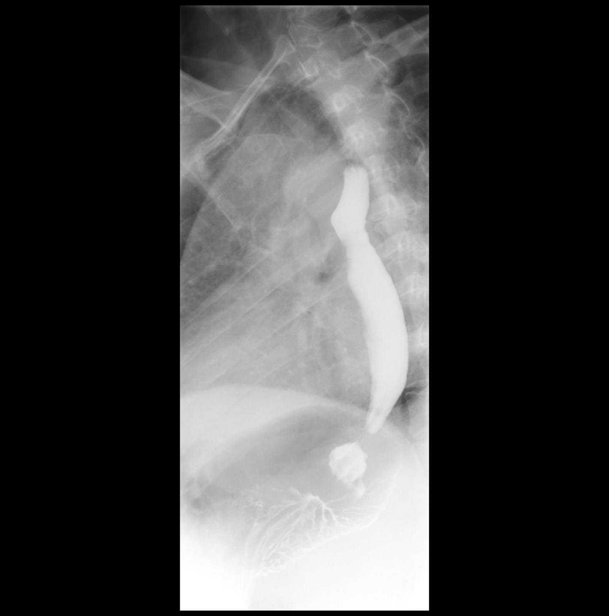
[frame 2/2]
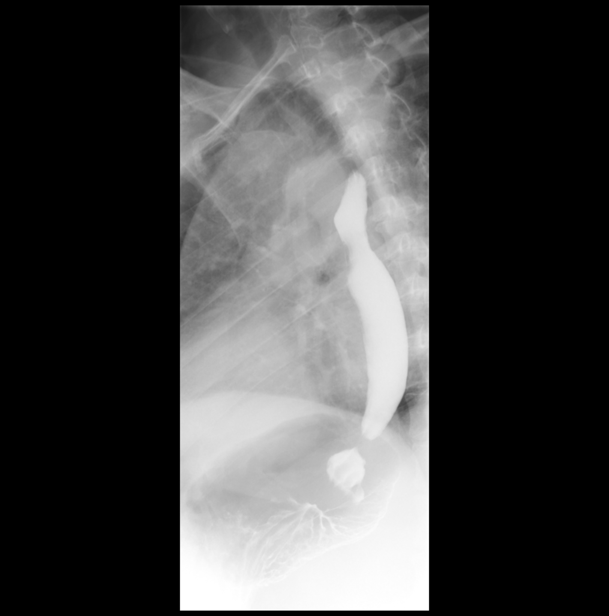

[15 of 19 positions shown; findings below may reference images not displayed]

FINDINGS: The mucosa and motility of the esophagus are normal. The
oropharyngeal swallowing mechanisms are normal.

There is no mass lesion or stricture. A 13 mm barium tablet passed
from the mouth to the stomach with no significant delay. There is a
tiny hiatal hernia.
IMPRESSION: Minimal hiatal hernia.  Otherwise normal barium esophagram.
